# Patient Record
Sex: Female | Born: 1945
Health system: Southern US, Community
[De-identification: ages and names within clinical notes are randomized; demographics above are authoritative.]

---

## 1998-12-18 ENCOUNTER — Other Ambulatory Visit: Admission: RE | Admit: 1998-12-18 | Discharge: 1998-12-18 | Payer: Self-pay | Admitting: Emergency Medicine

## 2000-07-23 ENCOUNTER — Other Ambulatory Visit: Admission: RE | Admit: 2000-07-23 | Discharge: 2000-07-23 | Payer: Self-pay | Admitting: Emergency Medicine

## 2000-11-05 ENCOUNTER — Ambulatory Visit (HOSPITAL_COMMUNITY): Admission: RE | Admit: 2000-11-05 | Discharge: 2000-11-05 | Payer: Self-pay | Admitting: Gastroenterology

## 2003-08-01 ENCOUNTER — Emergency Department (HOSPITAL_COMMUNITY): Admission: EM | Admit: 2003-08-01 | Discharge: 2003-08-02 | Payer: Self-pay | Admitting: Emergency Medicine

## 2005-10-09 ENCOUNTER — Encounter: Admission: RE | Admit: 2005-10-09 | Discharge: 2005-10-09 | Payer: Self-pay | Admitting: Occupational Medicine

## 2015-12-04 DIAGNOSIS — H40033 Anatomical narrow angle, bilateral: Secondary | ICD-10-CM | POA: Diagnosis not present

## 2015-12-04 DIAGNOSIS — H04123 Dry eye syndrome of bilateral lacrimal glands: Secondary | ICD-10-CM | POA: Diagnosis not present

## 2015-12-07 ENCOUNTER — Encounter (INDEPENDENT_AMBULATORY_CARE_PROVIDER_SITE_OTHER): Payer: Self-pay | Admitting: Ophthalmology

## 2016-01-04 ENCOUNTER — Encounter (INDEPENDENT_AMBULATORY_CARE_PROVIDER_SITE_OTHER): Payer: Self-pay | Admitting: Ophthalmology

## 2016-01-05 ENCOUNTER — Ambulatory Visit (INDEPENDENT_AMBULATORY_CARE_PROVIDER_SITE_OTHER): Payer: Medicare Other | Admitting: Family Medicine

## 2016-01-05 VITALS — BP 126/82 | HR 69 | Temp 98.4°F | Resp 17 | Ht 67.0 in | Wt 175.0 lb

## 2016-01-05 DIAGNOSIS — Z23 Encounter for immunization: Secondary | ICD-10-CM | POA: Diagnosis not present

## 2016-01-05 DIAGNOSIS — I10 Essential (primary) hypertension: Secondary | ICD-10-CM | POA: Diagnosis not present

## 2016-01-05 DIAGNOSIS — R9431 Abnormal electrocardiogram [ECG] [EKG]: Secondary | ICD-10-CM

## 2016-01-05 DIAGNOSIS — E559 Vitamin D deficiency, unspecified: Secondary | ICD-10-CM

## 2016-01-05 DIAGNOSIS — R011 Cardiac murmur, unspecified: Secondary | ICD-10-CM | POA: Diagnosis not present

## 2016-01-05 DIAGNOSIS — N184 Chronic kidney disease, stage 4 (severe): Secondary | ICD-10-CM

## 2016-01-05 LAB — COMPREHENSIVE METABOLIC PANEL
ALBUMIN: 3.7 g/dL (ref 3.6–5.1)
ALK PHOS: 42 U/L (ref 33–130)
ALT: 8 U/L (ref 6–29)
AST: 14 U/L (ref 10–35)
BUN: 18 mg/dL (ref 7–25)
CHLORIDE: 108 mmol/L (ref 98–110)
CO2: 26 mmol/L (ref 20–31)
CREATININE: 1.82 mg/dL — AB (ref 0.50–0.99)
Calcium: 9.2 mg/dL (ref 8.6–10.4)
Glucose, Bld: 87 mg/dL (ref 65–99)
POTASSIUM: 4.5 mmol/L (ref 3.5–5.3)
Sodium: 140 mmol/L (ref 135–146)
TOTAL PROTEIN: 6 g/dL — AB (ref 6.1–8.1)
Total Bilirubin: 0.6 mg/dL (ref 0.2–1.2)

## 2016-01-05 MED ORDER — LISINOPRIL 40 MG PO TABS: 40.0000 mg | ORAL_TABLET | Freq: Every day | ORAL | 3 refills | Status: DC

## 2016-01-05 NOTE — Progress Notes (Signed)
Subjective:  By signing my name below, I, [name], attest that this documentation has been prepared under the direction and in the presence of Norberto SorensonEva Joselynne Killam, MD Electronically Signed: Charline BillsEssence Howell, ED Scribe 01/05/2016 at 11:17 AM.   Patient ID: Jacqueline SchilderMercedes Lacaze, female    DOB: 08-12-1945, 70 y.o.   MRN: [id number]  Chief Complaint  Patient presents with  . Medication Refill    lisinopril   . Immunizations    flu shot    HPI HPI Comments: Jacqueline Powell is a 70 y.o. female, with a h/o HTN, who presents to the [facility] for medication refill of Lisinopril. Pt has been on the medication for a while and denies any side effects. She currently has 2 tablets remaining. Pt takes the medication every morning. She checks her BP at home with average readings of 130/80-90. She denies chest pain/pressure, palpations, cough, sob, leg swelling, dizziness, light-headedness. However, she reports dental extraction and reports 1 episode of dizziness then but none since. She last had lab work done last June and her last CPE was last year. Pt would like to receive the flu vaccine at this visit. She recently moved here to live with her son.   No past medical history on file. No current outpatient prescriptions on file prior to visit.   No current facility-administered medications on file prior to visit.    Allergies not on file  Review of Systems  Respiratory: Negative for cough and shortness of breath.   Cardiovascular: Negative for chest pain, palpitations and leg swelling.  Neurological: Negative for dizziness and light-headedness.   BP 126/82 (BP Location: Right Arm, Patient Position: Sitting, Cuff Size: Normal)   Pulse 69   Temp 98.4 F (36.9 C) (Oral)   Resp 17   Ht 5\' 7"  (1.702 m)   Wt 175 lb (79.4 kg)   SpO2 97%   BMI 27.41 kg/m     Objective:   Physical Exam  Constitutional: She is oriented to person, place, and time. She appears well-developed and  well-nourished. No distress.  HENT:  Head: Normocephalic and atraumatic.  Eyes: Conjunctivae and EOM are normal.  Neck: Neck supple. No tracheal deviation present.  Cardiovascular: Normal rate, regular rhythm, S1 normal and S2 normal.   Murmur heard.  Systolic murmur is present with a grade of 2/6  Normal valves.   Pulmonary/Chest: Effort normal and breath sounds normal. No respiratory distress.  Musculoskeletal: Normal range of motion.  Neurological: She is alert and oriented to person, place, and time.  Skin: Skin is warm and dry.  Psychiatric: She has a normal mood and affect. Her behavior is normal.  Nursing note and vitals reviewed.    EKG: Normal sinus rhythm, flipped T's in 2, 3, and aVF. Assessment & Plan:   1. Essential hypertension, benign   2. Vitamin D deficiency   3. Undiagnosed cardiac murmurs   4. Need for prophylactic vaccination and inoculation against influenza   5. Nonspecific abnormal electrocardiogram (ECG) (EKG)   Refilled lisinopril, bp well controlled Pt Asymptomatic but does have new heart murmur and EKG changes consistent with prior ischemic event so referred to cardiology for further evaluation. Patient advised to follow-up anytime after 1 month to establish care with his PCP. Encouraged patient to establish with a new physician Dr. Alvy BimlerSagardia.  Orders Placed This Encounter  Procedures  . Flu Vaccine QUAD 36+ mos IM  . Comprehensive metabolic panel  . VITAMIN D 25 Hydroxy (Vit-D Deficiency, Fractures)  .  Ambulatory referral to Cardiology    Referral Priority:   Routine    Referral Type:   Consultation    Referral Reason:   Specialty Services Required    Requested Specialty:   Cardiology    Number of Visits Requested:   1  . EKG 12-Lead    Meds ordered this encounter  Medications  . lisinopril (PRINIVIL,ZESTRIL) 40 MG tablet    Sig: Take 1 tablet (40 mg total) by mouth daily.    Dispense:  90 tablet    Refill:  3    I personally performed the  services described in this documentation, which was scribed in my presence. The recorded information has been reviewed and considered, and addended by me as needed.   Norberto SorensonEva Rajiv Parlato, M.D.  [facility]  [place] [address] [place], [place] [address] [phone] phone [phone]) [phone] fax  01/05/16 12:09 PM

## 2016-01-05 NOTE — Patient Instructions (Addendum)
IF you received an x-ray today, you will receive an invoice from [facility]. Please contact [facility] at [phone] with questions or concerns regarding your invoice.   IF you received labwork today, you will receive an invoice from [facility]. Please contact [facility] at [phone] with questions or concerns regarding your invoice.   Our billing staff will not be able to assist you with questions regarding bills from these companies.  You will be contacted with the lab results as soon as they are available. The fastest way to get your results is to activate your My Chart account. Instructions are located on the last page of this paperwork. If you have not heard from us regarding the results in 2 weeks, please contact this office.    We recommend that you schedule a mammogram for breast cancer screening. Typically, you do not need a referral to do this. Please contact a local imaging center to schedule your mammogram.  [facility] - [phone]) [phone]  *ask for the Radiology Department [facility]) - [phone] or [phone]) [phone]  [facility] - [phone] [facility] - [phone]) [phone] [facility] - [phone]  *ask for the Radiology Department [facility] - [phone]  *ask for the Radiology Department [facility] - [phone]  *ask for the Mammography Department [facility] - [phone]  Soplo cardaco (Heart Murmur) Un soplo cardaco es un sonido adicional que su mdico oye cuando escucha su corazn con un dispositivo denominado estetoscopio. El sonido proviene de una turbulencia provocada cuando la sangre circula a travs del corazn y el sonido puede ser como un zumbido o un silbido cuando el corazn late. Existen dos tipos de soplos cardacos:  Soplo cardaco inocente. La mayora de las personas con este tipo de soplo  no tienen un problema cardaco. Muchos nios tienen soplos cardacos inocentes. Su mdico puede sugerir Jabil Circuitalgunos estudios bsicos para saber si el soplo es un soplo inocente. Si se descubre un soplo cardaco inocente, no hay necesidad de Sears Holdings Corporationhacer otros estudios, Tax inspectoradministrar tratamiento, restringir las actividades ni suspender la prctica de deportes.  Soplo cardaco anormal. Este tipo de soplo cardaco puede aparecer en nios y adultos. En los nios, por lo general los soplos cardacos anormales son causados por defectos cardacos que estn presentes desde el nacimiento (congnitos). En los adultos, los soplos anormales normalmente provienen de problemas en las vlvulas cardacas causados por una enfermedad, infeccin o el envejecimiento. CAUSAS Normalmente, estas vlvulas se abren para dejar que la sangre circule a travs o fuera del corazn y Engineer, miningluego se cierran para evitar que la sangre regrese. Si las vlvulas no funcionan correctamente, puede experimentar los siguientes sntomas:  Regurgitacin: cuando la sangre se filtra a travs de la vlvula en la direccin incorrecta.  Prolapso de la vlvula mitral: cuando la vlvula mitral del corazn tiene una aleta suelta y no se cierra hermticamente.  Estenosis: cuando la vlvula no se abre lo suficiente y bloquea el flujo sanguneo. Blake DivineSIGNOS Y SNTOMAS Los soplos inocentes no provocan sntomas, y Alexandratownmuchas personas con soplos anormales pueden tener sntomas o no. Si hay sntomas, pueden ser:  Harrel LemonFalta de aire.  Color azul en la piel, especialmente en las puntas de los dedos.  Dolor en el pecho.  Palpitaciones, sentir un aleteo o latido cardaco irregular.  Desmayos.  Tos persistente.  Cansarse con mucha ms rapidez de lo esperado. DIAGNSTICO Un soplo cardaco se puede escuchar  durante un examen mdico deportivo o durante otro tipo de examen. Cuando se escucha un soplo, esto puede sugerir un posible problema. Cuando esto ocurre, es posible que el mdico  le recomiende ver a Journalist, newspaperun especialista cardaco (cardilogo). Tambin le pueden pedir que se realice uno o ms estudios cardacos. En Franklin Resourcesestos casos, los estudios pueden variar segn lo que escuch su mdico. Estos son algunos de los estudios indicados para un soplo cardaco:  Materials engineerlectrocardiograma.  Ecocardiograma.  Resonancia magntica. Para nios y adultos que tienen un soplo cardaco anormal y desean Microbiologistpracticar deportes, es importante completar los estudios, Chiropractoranalizar los Admireresultados con su mdico y seguir sus TEFL teacherrecomendaciones. Si hay una enfermedad cardaca, es posible que no sea seguro practicar un deporte. TRATAMIENTO Los soplos inocentes no requieren tratamiento ni restriccin de actividades. Si un soplo anormal representa un problema en el corazn, el tratamiento depender de la naturaleza exacta del problema. En Franklin Resourcesestos casos, es posible que sean necesarios medicamentos o ciruga para tratar el problema. INSTRUCCIONES PARA EL CUIDADO EN EL HOGAR Si desea practicar un deporte u otros tipos de [place] Grenadinesactividad fsica intensa, es importante analizar esta situacin primero con su mdico. Si el soplo representa un problema en el corazn y decide practicar un deporte, existe una pequea probabilidad de que suceda un problema grave (incluida muerte sbita). SOLICITE ATENCIN MDICA SI:  Siente que sus sntomas empeoran lentamente.  Desarrolla sntomas nuevos que le preocupan.  Siente que los medicamentos recetados le provocan efectos secundarios. SOLICITE ATENCIN MDICA DE INMEDIATO SI:  Siente dolor en el pecho.  Le falta el aire.  Observa que su corazn late de forma irregular, con una frecuencia preocupante  Sufre episodios de Baxter Internationaldesmayo.  Los sntomas empeoran repentinamente. Esta informacin no tiene Theme park managercomo fin reemplazar el consejo del mdico. Asegrese de hacerle al mdico cualquier pregunta que tenga. Document Released: 02/04/2005 Document Revised: 02/25/2014 Document Reviewed: 08/10/2015 Elsevier  Interactive Patient Education  2017 Arvin[employer]vier Inc.

## 2016-01-06 LAB — VITAMIN D 25 HYDROXY (VIT D DEFICIENCY, FRACTURES): VIT D 25 HYDROXY: 22 ng/mL — AB (ref 30–100)

## 2016-01-06 MED ORDER — VITAMIN D (ERGOCALCIFEROL) 1.25 MG (50000 UNIT) PO CAPS: 50000.0000 [IU] | ORAL_CAPSULE | ORAL | 0 refills | Status: AC

## 2016-01-06 MED ORDER — AMLODIPINE BESYLATE 10 MG PO TABS: 10.0000 mg | ORAL_TABLET | Freq: Every day | ORAL | 0 refills | Status: AC

## 2016-01-06 NOTE — Addendum Note (Signed)
Addended by: Norberto SorensonSHAW, EVA on: 01/06/2016 11:42 AM   Modules accepted: Orders

## 2016-01-27 ENCOUNTER — Encounter: Payer: Self-pay | Admitting: *Deleted

## 2016-02-17 ENCOUNTER — Other Ambulatory Visit: Payer: Self-pay | Admitting: Family Medicine

## 2016-02-17 ENCOUNTER — Other Ambulatory Visit: Payer: Medicare Other

## 2016-02-17 DIAGNOSIS — N184 Chronic kidney disease, stage 4 (severe): Secondary | ICD-10-CM | POA: Diagnosis not present

## 2016-02-18 ENCOUNTER — Other Ambulatory Visit: Payer: Self-pay | Admitting: Family Medicine

## 2016-02-18 DIAGNOSIS — N184 Chronic kidney disease, stage 4 (severe): Secondary | ICD-10-CM | POA: Insufficient documentation

## 2016-02-18 LAB — CBC WITH DIFFERENTIAL/PLATELET
BASOS ABS: 0 10*3/uL (ref 0.0–0.2)
Basos: 1 %
EOS (ABSOLUTE): 0.2 10*3/uL (ref 0.0–0.4)
Eos: 3 %
HEMATOCRIT: 39.5 % (ref 34.0–46.6)
Hemoglobin: 13.4 g/dL (ref 11.1–15.9)
Immature Grans (Abs): 0 10*3/uL (ref 0.0–0.1)
Immature Granulocytes: 0 %
LYMPHS ABS: 2.1 10*3/uL (ref 0.7–3.1)
Lymphs: 35 %
MCH: 27.7 pg (ref 26.6–33.0)
MCHC: 33.9 g/dL (ref 31.5–35.7)
MCV: 82 fL (ref 79–97)
MONOS ABS: 0.4 10*3/uL (ref 0.1–0.9)
Monocytes: 6 %
Neutrophils Absolute: 3.4 10*3/uL (ref 1.4–7.0)
Neutrophils: 55 %
Platelets: 171 10*3/uL (ref 150–379)
RBC: 4.84 x10E6/uL (ref 3.77–5.28)
RDW: 14.6 % (ref 12.3–15.4)
WBC: 6.2 10*3/uL (ref 3.4–10.8)

## 2016-02-18 LAB — CMP14+EGFR
ALK PHOS: 57 IU/L (ref 39–117)
ALT: 10 IU/L (ref 0–32)
AST: 16 IU/L (ref 0–40)
Albumin/Globulin Ratio: 1.5 (ref 1.2–2.2)
Albumin: 4.3 g/dL (ref 3.6–4.8)
BILIRUBIN TOTAL: 0.4 mg/dL (ref 0.0–1.2)
BUN/Creatinine Ratio: 11 — ABNORMAL LOW (ref 12–28)
BUN: 24 mg/dL (ref 8–27)
CHLORIDE: 103 mmol/L (ref 96–106)
CO2: 20 mmol/L (ref 18–29)
Calcium: 9.7 mg/dL (ref 8.7–10.3)
Creatinine, Ser: 2.09 mg/dL — ABNORMAL HIGH (ref 0.57–1.00)
GFR calc Af Amer: 27 mL/min/{1.73_m2} — ABNORMAL LOW (ref >59)
GFR calc non Af Amer: 24 mL/min/{1.73_m2} — ABNORMAL LOW (ref >59)
GLUCOSE: 83 mg/dL (ref 65–99)
Globulin, Total: 2.9 g/dL (ref 1.5–4.5)
Potassium: 4.8 mmol/L (ref 3.5–5.2)
Sodium: 140 mmol/L (ref 134–144)
TOTAL PROTEIN: 7.2 g/dL (ref 6.0–8.5)

## 2016-02-18 LAB — MICROALBUMIN / CREATININE URINE RATIO
CREATININE, UR: 59.9 mg/dL
MICROALB/CREAT RATIO: 104.3 mg/g{creat} — AB (ref 0.0–30.0)
MICROALBUM., U, RANDOM: 62.5 ug/mL

## 2016-02-18 NOTE — Progress Notes (Signed)
Here for lab only visit. Has appt in 3d to review lab results

## 2016-02-20 ENCOUNTER — Ambulatory Visit: Payer: Medicare Other

## 2016-02-21 ENCOUNTER — Encounter: Payer: Self-pay | Admitting: Emergency Medicine

## 2016-02-21 ENCOUNTER — Ambulatory Visit (INDEPENDENT_AMBULATORY_CARE_PROVIDER_SITE_OTHER): Payer: 59 | Admitting: Emergency Medicine

## 2016-02-21 VITALS — BP 166/80 | HR 103 | Temp 98.1°F | Resp 16 | Wt 175.2 lb

## 2016-02-21 DIAGNOSIS — N184 Chronic kidney disease, stage 4 (severe): Secondary | ICD-10-CM | POA: Diagnosis not present

## 2016-02-21 DIAGNOSIS — I10 Essential (primary) hypertension: Secondary | ICD-10-CM | POA: Diagnosis not present

## 2016-02-21 MED ORDER — LISINOPRIL 20 MG PO TABS: 20.0000 mg | ORAL_TABLET | Freq: Every day | ORAL | 3 refills | Status: AC

## 2016-02-21 NOTE — Patient Instructions (Signed)
Hipertensin (Hypertension) El trmino hipertensin es otra forma de denominar a la presin arterial elevada. La presin arterial elevada fuerza al corazn a trabajar ms para bombear la sangre. Una lectura de la presin arterial consta de dos nmeros: uno ms alto sobre uno ms bajo (por ejemplo, 110/72). CUIDADOS EN EL HOGAR  Haga que el mdico le tome nuevamente la presin arterial.  Tome los medicamentos solamente como se lo haya indicado el mdico. Siga cuidadosamente las indicaciones. Los medicamentos pierden eficacia si omite dosis. El hecho de omitir las dosis tambin Lesothoaumenta el riesgo de otros problemas.  No fume.  Contrlese la presin arterial en su casa como se lo haya indicado el mdico. SOLICITE AYUDA SI:  Piensa que tiene una reaccin a los medicamentos que est tomando.  Tiene mareos o dolores de cabeza reiterados.  Se le inflaman (hinchan) los tobillos.  Tiene problemas de visin. SOLICITE AYUDA DE INMEDIATO SI:  Tiene un dolor de cabeza muy intenso y est confundido.  Se siente dbil, aturdido o se desmaya.  Tiene dolor en el pecho o el estmago (abdominal).  Tiene vmitos.  No puede respirar Kimberly-Clarkmuy bien. ASEGRESE DE QUE:  Comprende estas instrucciones.  Controlar su afeccin.  Recibir ayuda de inmediato si no mejora o si empeora. Esta informacin no tiene Theme park managercomo fin reemplazar el consejo del mdico. Asegrese de hacerle al mdico cualquier pregunta que tenga. Document Released: 07/25/2009 Document Revised: 02/09/2013 Document Reviewed: 11/27/2012 Elsevier Interactive Patient Education  2017 Elsevier Inc. Enfermedad renal crnica en los adultos (Chronic Kidney Disease, Adult) La enfermedad renal crnica se produce cuando los riones sufren un dao durante un perodo de 3meses o ms. Los riones son dos rganos que cumplen muchas funciones importantes en el organismo. Estas funciones incluyen las siguientes:  Eliminar desechos y el exceso de lquido de  la Gant[place]angre.  Producir hormonas que Bed Bath & Beyondmantienen la cantidad de lquido Teachers Insurance and Annuity Associationen los tejidos y los vasos sanguneos.  Mantener la cantidad correcta de lquidos y de sustancias qumicas en el organismo. La mayora de las veces, esta afeccin no desaparece, pero a menudo puede ser Winn-Dixiecontrolada. Se deben tomar medidas para frenar el dao renal o evitar que empeore. Weyerhaeuser CompanyDe otro modo, los riones pueden dejar de funcionar. CUIDADOS EN EL HOGAR  Siga su dieta segn las indicaciones de su mdico. Tal vez deba evitar el alcohol, los alimentos salados (sodio) y aquellos con alto contenido de potasio, calcio y protenas.  Tome los medicamentos de venta libre y los recetados solamente como se lo haya indicado el mdico. No tome ningn medicamento nuevo a menos que el mdico lo autorice. Estos incluyen las vitaminas y los minerales.  Los medicamentos y los suplementos nutricionales pueden agravar el dao renal.  Es posible que el mdico deba modificar la cantidad de medicamentos que toma.  No use productos que contengan tabaco. Estos incluyen cigarrillos, tabaco para mascar y Administrator, Civil Servicecigarrillos electrnicos. Si necesita ayuda para dejar de fumar, consulte al mdico.  Concurra a todas las visitas de control como se lo haya indicado el mdico. Esto es importante.  Controlar su presin arterial. Informe al mdico si tiene cambios en la presin arterial.  Alcance un peso saludable. Mantenga ese peso. Si necesita ayuda para lograrlo, consulte al mdico.  Comience o contine un plan de ejercicios. Intente hacer ejercicios al menos 30minutos al da, 5das a la semana.  Mantngase al da con las vacunas como se lo haya indicado el mdico. SOLICITE AYUDA SI:  Los sntomas empeoran.  Aparecen nuevos sntomas. SOLICITE AYUDA  DE INMEDIATO SI:  Tiene sntomas de enfermedad renal terminal. Estos incluyen los siguientes:  Dolores de [place].  Piel ms oscura o ms clara que lo normal.  Adormecimiento de las manos o de los  pies.  Aparecen hematomas con facilidad.  Hipo frecuente.  Dolor en el pecho.  Falta de aire.  Ausencia de la [employer].  Tiene fiebre.  Orina muy poco.  Siente dolor o sangra al Beatrix Shipper. Esta informacin no tiene Theme park manager el consejo del mdico. Asegrese de hacerle al mdico cualquier pregunta que tenga. Document Released: 03/09/2010 Document Revised: 05/29/2015 Document Reviewed: 10/04/2011 Elsevier Interactive Patient Education  2017 Arvin[employer].

## 2016-02-21 NOTE — Progress Notes (Signed)
Novia Justus 71 y.o.   Chief Complaint  Patient presents with  . Follow-up    abnormal labs    HISTORY OF PRESENT ILLNESS: This is a 71 y.o. female here for follow up, evaluation of HTN with CKD. Was started on Amlodipine and Lisinopril discontinued; she was taking 40mg  for many years. Asymptomatic, has no complaints.  HPI   Prior to Admission medications   Medication Sig Start Date End Date Taking? Authorizing Provider  amLODipine (NORVASC) 10 MG tablet Take 1 tablet (10 mg total) by mouth daily. 01/06/16  Yes Sherren Mocha, MD  Vitamin D, Ergocalciferol, (DRISDOL) 50000 units CAPS capsule Take 1 capsule (50,000 Units total) by mouth every 7 (seven) days. 01/06/16  Yes Sherren Mocha, MD    No Known Allergies  Patient Active Problem List   Diagnosis Date Noted  . CRF (chronic renal failure), stage 4 (severe) (HCC) 02/18/2016    No past medical history on file.  No past surgical history on file.  Social History   Social History  . Marital status: Divorced    Spouse name: N/A  . Number of children: N/A  . Years of education: N/A   Occupational History  . Not on file.   Social History Main Topics  . Smoking status: Never Smoker  . Smokeless tobacco: Never Used  . Alcohol use No  . Drug use: No  . Sexual activity: Not on file   Other Topics Concern  . Not on file   Social History Narrative  . No narrative on file    No family history on file.   Review of Systems  Constitutional: Negative.   HENT: Negative.   Eyes: Negative.   Respiratory: Negative.   Cardiovascular: Negative.   Gastrointestinal: Negative.   Genitourinary: Negative.   Musculoskeletal: Negative.   Skin:       Mild LE edema since starting Amlodipine.  Neurological: Negative.   Endo/Heme/Allergies: Negative.   Psychiatric/Behavioral: Negative.   All other systems reviewed and are negative.   Vitals:   02/21/16 1053  BP: (!) 166/80  Pulse: (!) 103  Resp: 16  Temp: 98.1 F  (36.7 C)    Physical Exam  Constitutional: She is oriented to person, place, and time. She appears well-developed and well-nourished.  HENT:  Head: Normocephalic and atraumatic.  Nose: Nose normal.  Mouth/Throat: Oropharynx is clear and moist.  Eyes: Conjunctivae and EOM are normal. Pupils are equal, round, and reactive to light.  Neck: Normal range of motion. Neck supple.  Cardiovascular: Normal rate, regular rhythm, normal heart sounds and intact distal pulses.   Pulmonary/Chest: Effort normal and breath sounds normal.  Abdominal: Soft. Bowel sounds are normal. There is no tenderness.  Musculoskeletal: She exhibits edema (mild non-pitting LE).  Neurological: She is alert and oriented to person, place, and time.  Skin: Skin is warm and dry. Capillary refill takes less than 2 seconds.  Psychiatric: She has a normal mood and affect. Her behavior is normal.   Labs reviewed.   ASSESSMENT & PLAN: Meghann was seen today for follow-up.  Diagnoses and all orders for this visit:  Essential hypertension -     Ambulatory referral to Nephrology  Stage 4 chronic kidney disease (HCC)  Other orders -     lisinopril (PRINIVIL,ZESTRIL) 20 MG tablet; Take 1 tablet (20 mg total) by mouth daily.  Worsening renal function with significant microalbuminuria and hypertension. Will re-start Lisinopril and obtain renal consult.   Patient Instructions  Hipertensin (Hypertension) El  trmino hipertensin es otra forma de denominar a la presin arterial elevada. La presin arterial elevada fuerza al corazn a trabajar ms para bombear la sangre. Una lectura de la presin arterial consta de dos nmeros: uno ms alto sobre uno ms bajo (por ejemplo, 110/72). CUIDADOS EN EL HOGAR  Haga que el mdico le tome nuevamente la presin arterial.  Tome los medicamentos solamente como se lo haya indicado el mdico. Siga cuidadosamente las indicaciones. Los medicamentos pierden eficacia si omite dosis. El  hecho de omitir las dosis tambin Lesothoaumenta el riesgo de otros problemas.  No fume.  Contrlese la presin arterial en su casa como se lo haya indicado el mdico. SOLICITE AYUDA SI:  Piensa que tiene una reaccin a los medicamentos que est tomando.  Tiene mareos o dolores de cabeza reiterados.  Se le inflaman (hinchan) los tobillos.  Tiene problemas de visin. SOLICITE AYUDA DE INMEDIATO SI:  Tiene un dolor de cabeza muy intenso y est confundido.  Se siente dbil, aturdido o se desmaya.  Tiene dolor en el pecho o el estmago (abdominal).  Tiene vmitos.  No puede respirar Kimberly-Clarkmuy bien. ASEGRESE DE QUE:  Comprende estas instrucciones.  Controlar su afeccin.  Recibir ayuda de inmediato si no mejora o si empeora. Esta informacin no tiene Theme park managercomo fin reemplazar el consejo del mdico. Asegrese de hacerle al mdico cualquier pregunta que tenga. Document Released: 07/25/2009 Document Revised: 02/09/2013 Document Reviewed: 11/27/2012 Elsevier Interactive Patient Education  2017 Elsevier Inc. Enfermedad renal crnica en los adultos (Chronic Kidney Disease, Adult) La enfermedad renal crnica se produce cuando los riones sufren un dao durante un perodo de 3meses o ms. Los riones son dos rganos que cumplen muchas funciones importantes en el organismo. Estas funciones incluyen las siguientes:  Eliminar desechos y el exceso de lquido de la Jenisonsangre.  Producir hormonas que Bed Bath & Beyondmantienen la cantidad de lquido Teachers Insurance and Annuity Associationen los tejidos y los vasos sanguneos.  Mantener la cantidad correcta de lquidos y de sustancias qumicas en el organismo. La mayora de las veces, esta afeccin no desaparece, pero a menudo puede ser Winn-Dixiecontrolada. Se deben tomar medidas para frenar el dao renal o evitar que empeore. Weyerhaeuser CompanyDe otro modo, los riones pueden dejar de funcionar. CUIDADOS EN EL HOGAR  Siga su dieta segn las indicaciones de su mdico. Tal vez deba evitar el alcohol, los alimentos salados (sodio) y  aquellos con alto contenido de potasio, calcio y protenas.  Tome los medicamentos de venta libre y los recetados solamente como se lo haya indicado el mdico. No tome ningn medicamento nuevo a menos que el mdico lo autorice. Estos incluyen las vitaminas y los minerales.  Los medicamentos y los suplementos nutricionales pueden agravar el dao renal.  Es posible que el mdico deba modificar la cantidad de medicamentos que toma.  No use productos que contengan tabaco. Estos incluyen cigarrillos, tabaco para mascar y Administrator, Civil Servicecigarrillos electrnicos. Si necesita ayuda para dejar de fumar, consulte al mdico.  Concurra a todas las visitas de control como se lo haya indicado el mdico. Esto es importante.  Controlar su presin arterial. Informe al mdico si tiene cambios en la presin arterial.  Alcance un peso saludable. Mantenga ese peso. Si necesita ayuda para lograrlo, consulte al mdico.  Comience o contine un plan de ejercicios. Intente hacer ejercicios al menos 30minutos al da, 5das a la semana.  Mantngase al da con las vacunas como se lo haya indicado el mdico. SOLICITE AYUDA SI:  Los sntomas empeoran.  Aparecen nuevos sntomas. SOLICITE AYUDA DE INMEDIATO  SI:  Tiene sntomas de enfermedad renal terminal. Estos incluyen los siguientes:  Dolores de [place].  Piel ms oscura o ms clara que lo normal.  Adormecimiento de las manos o de los pies.  Aparecen hematomas con facilidad.  Hipo frecuente.  Dolor en el pecho.  Falta de aire.  Ausencia de la [employer].  Tiene fiebre.  Orina muy poco.  Siente dolor o sangra al Beatrix Shipper. Esta informacin no tiene Theme park manager el consejo del mdico. Asegrese de hacerle al mdico cualquier pregunta que tenga. Document Released: 03/09/2010 Document Revised: 05/29/2015 Document Reviewed: 10/04/2011 Elsevier Interactive Patient Education  2017 Elsevier Inc.      Edwina Barth, MD [facility]

## 2016-03-20 ENCOUNTER — Telehealth: Payer: Self-pay

## 2016-03-20 NOTE — Telephone Encounter (Signed)
[facility] has scheduled an apt with pt via our referral (apt is 2/6/1). They discovered that the pt has [employer] which requires insurance referrals before any office visits. Her primary care is listed as a dr that does not work at our office. We have filed this insurance today as the pt did not present a card during her last visit.   Pt needs to change PCP with insurance to Dr Clelia CroftShaw if she wants to continue with the referral and continue seeing our office.  I left a VM with Iris (niece/listed on release) stating the above information and asking for them to call and change the PCP. The number to call is [phone].

## 2016-04-05 ENCOUNTER — Other Ambulatory Visit: Payer: Self-pay | Admitting: Internal Medicine

## 2016-05-09 ENCOUNTER — Other Ambulatory Visit: Payer: Self-pay | Admitting: Internal Medicine

## 2016-05-09 DIAGNOSIS — N184 Chronic kidney disease, stage 4 (severe): Secondary | ICD-10-CM

## 2016-05-09 DIAGNOSIS — R809 Proteinuria, unspecified: Secondary | ICD-10-CM | POA: Diagnosis not present

## 2016-05-16 ENCOUNTER — Ambulatory Visit
Admission: RE | Admit: 2016-05-16 | Discharge: 2016-05-16 | Disposition: A | Discharge status: Home / Self Care | Payer: 59 | Source: Ambulatory Visit | Attending: Internal Medicine | Admitting: Internal Medicine

## 2016-05-16 DIAGNOSIS — N184 Chronic kidney disease, stage 4 (severe): Secondary | ICD-10-CM

## 2016-06-06 ENCOUNTER — Ambulatory Visit
Admission: RE | Admit: 2016-06-06 | Discharge: 2016-06-06 | Disposition: A | Discharge status: Home / Self Care | Payer: 59 | Source: Ambulatory Visit | Attending: Internal Medicine | Admitting: Internal Medicine

## 2016-06-06 ENCOUNTER — Other Ambulatory Visit: Payer: Self-pay | Admitting: Internal Medicine

## 2016-06-06 DIAGNOSIS — R079 Chest pain, unspecified: Secondary | ICD-10-CM

## 2016-06-12 ENCOUNTER — Other Ambulatory Visit (HOSPITAL_COMMUNITY): Payer: Self-pay | Admitting: Internal Medicine

## 2016-06-12 DIAGNOSIS — R071 Chest pain on breathing: Secondary | ICD-10-CM

## 2016-06-13 ENCOUNTER — Encounter (HOSPITAL_COMMUNITY): Payer: Self-pay | Admitting: Radiology

## 2016-06-13 ENCOUNTER — Ambulatory Visit (HOSPITAL_COMMUNITY)
Admission: RE | Admit: 2016-06-13 | Discharge: 2016-06-13 | Disposition: A | Discharge status: Home / Self Care | Payer: Medicare (Managed Care) | Source: Ambulatory Visit | Attending: Internal Medicine | Admitting: Internal Medicine

## 2016-06-13 ENCOUNTER — Encounter (HOSPITAL_COMMUNITY)
Admission: RE | Admit: 2016-06-13 | Discharge: 2016-06-13 | Disposition: A | Discharge status: Home / Self Care | Payer: Medicare (Managed Care) | Source: Ambulatory Visit | Attending: Internal Medicine | Admitting: Internal Medicine

## 2016-06-13 DIAGNOSIS — Q613 Polycystic kidney, unspecified: Secondary | ICD-10-CM | POA: Diagnosis not present

## 2016-06-13 DIAGNOSIS — R071 Chest pain on breathing: Secondary | ICD-10-CM

## 2016-06-13 DIAGNOSIS — N182 Chronic kidney disease, stage 2 (mild): Secondary | ICD-10-CM | POA: Diagnosis not present

## 2016-06-13 DIAGNOSIS — R7989 Other specified abnormal findings of blood chemistry: Secondary | ICD-10-CM | POA: Insufficient documentation

## 2016-06-13 MED ORDER — TECHNETIUM TC 99M DIETHYLENETRIAME-PENTAACETIC ACID: 32.2000 | Freq: Once | INTRAVENOUS | Status: DC | PRN

## 2016-06-13 MED ORDER — TECHNETIUM TO 99M ALBUMIN AGGREGATED: 4.2000 | Freq: Once | INTRAVENOUS | Status: DC | PRN

## 2016-06-19 ENCOUNTER — Ambulatory Visit (HOSPITAL_COMMUNITY): Payer: 59

## 2016-06-24 DIAGNOSIS — K090 Developmental odontogenic cysts: Secondary | ICD-10-CM | POA: Diagnosis not present

## 2017-09-01 DIAGNOSIS — R42 Dizziness and giddiness: Secondary | ICD-10-CM | POA: Diagnosis not present

## 2017-09-01 DIAGNOSIS — R935 Abnormal findings on diagnostic imaging of other abdominal regions, including retroperitoneum: Secondary | ICD-10-CM | POA: Diagnosis not present

## 2017-09-01 DIAGNOSIS — R9431 Abnormal electrocardiogram [ECG] [EKG]: Secondary | ICD-10-CM | POA: Diagnosis not present

## 2017-09-01 DIAGNOSIS — R944 Abnormal results of kidney function studies: Secondary | ICD-10-CM | POA: Diagnosis not present

## 2018-07-02 IMAGING — CR DG CHEST 2V
2 series · 2 of 2 positions shown · non-contrast
Comparison: 08/01/2003 report.

CLINICAL DATA: Chest wall pain.

EXAM:
CHEST  2 VIEW

[w chest pa]
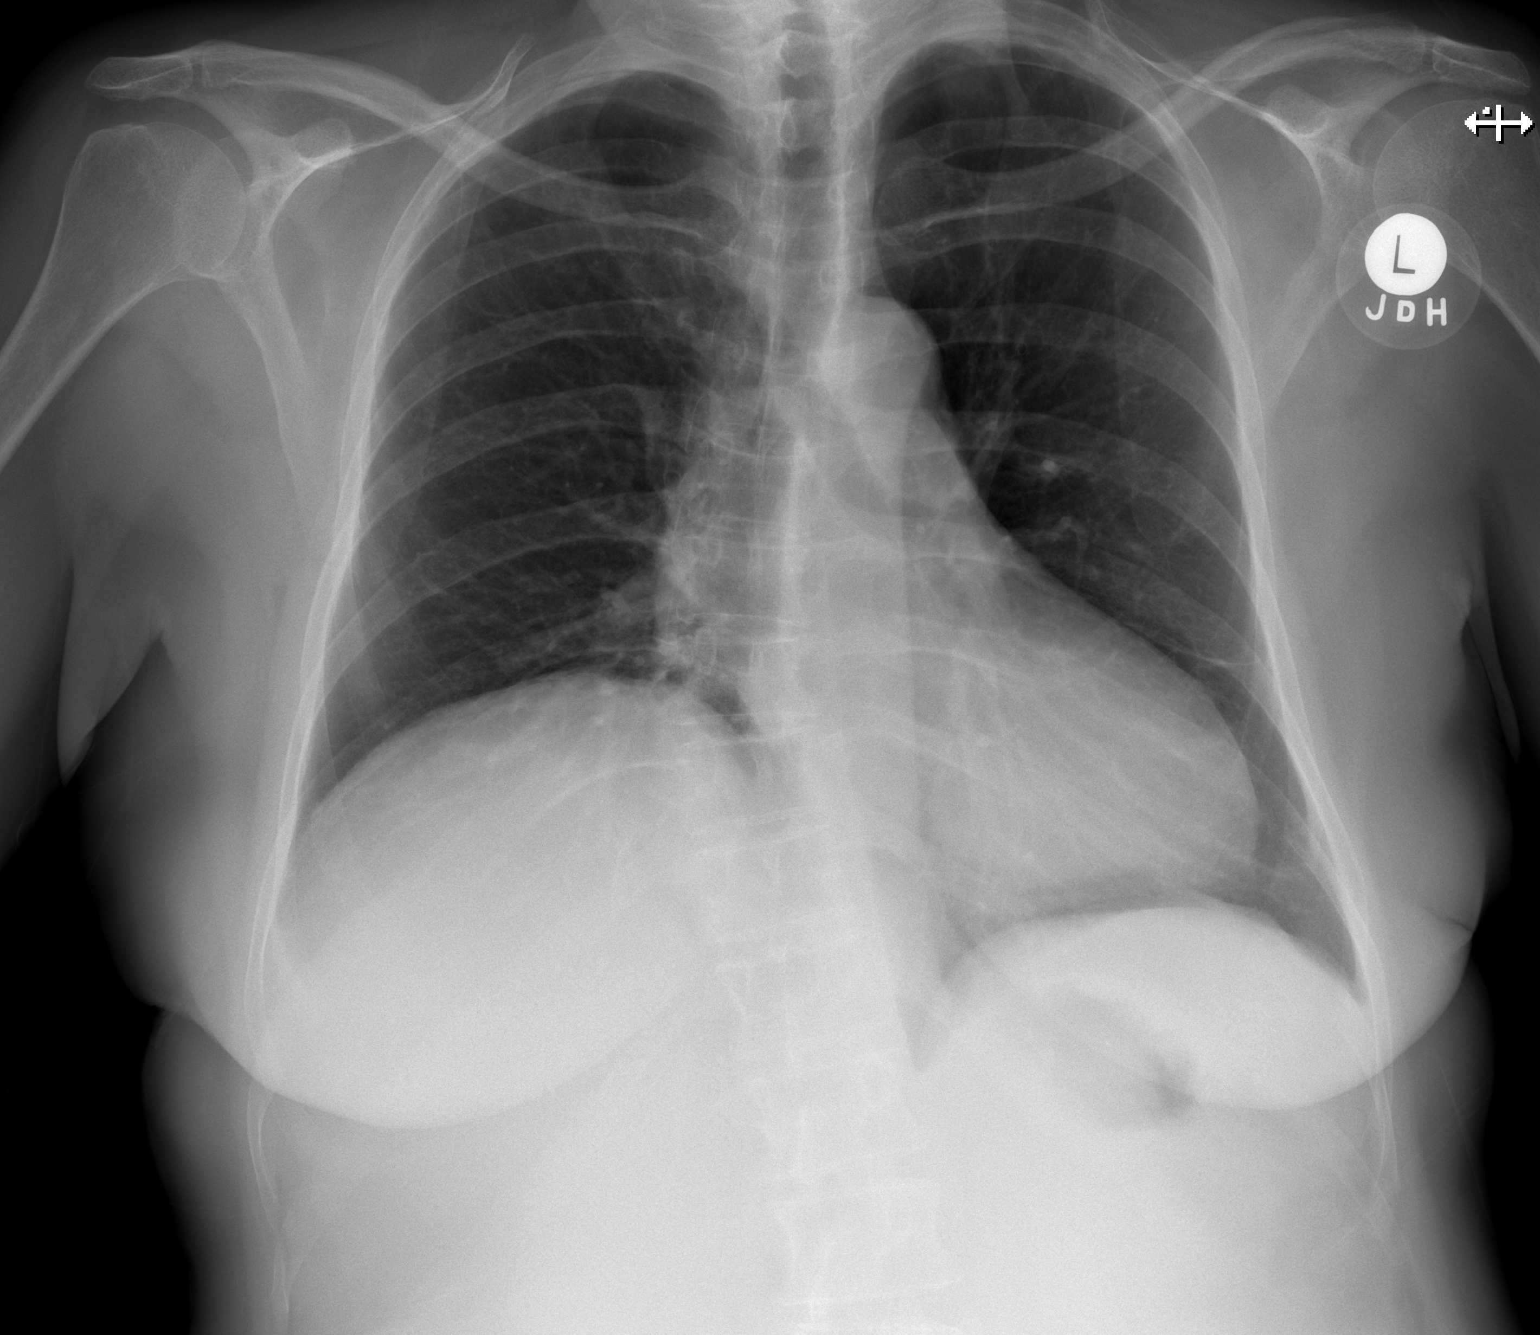

[w chest lat]
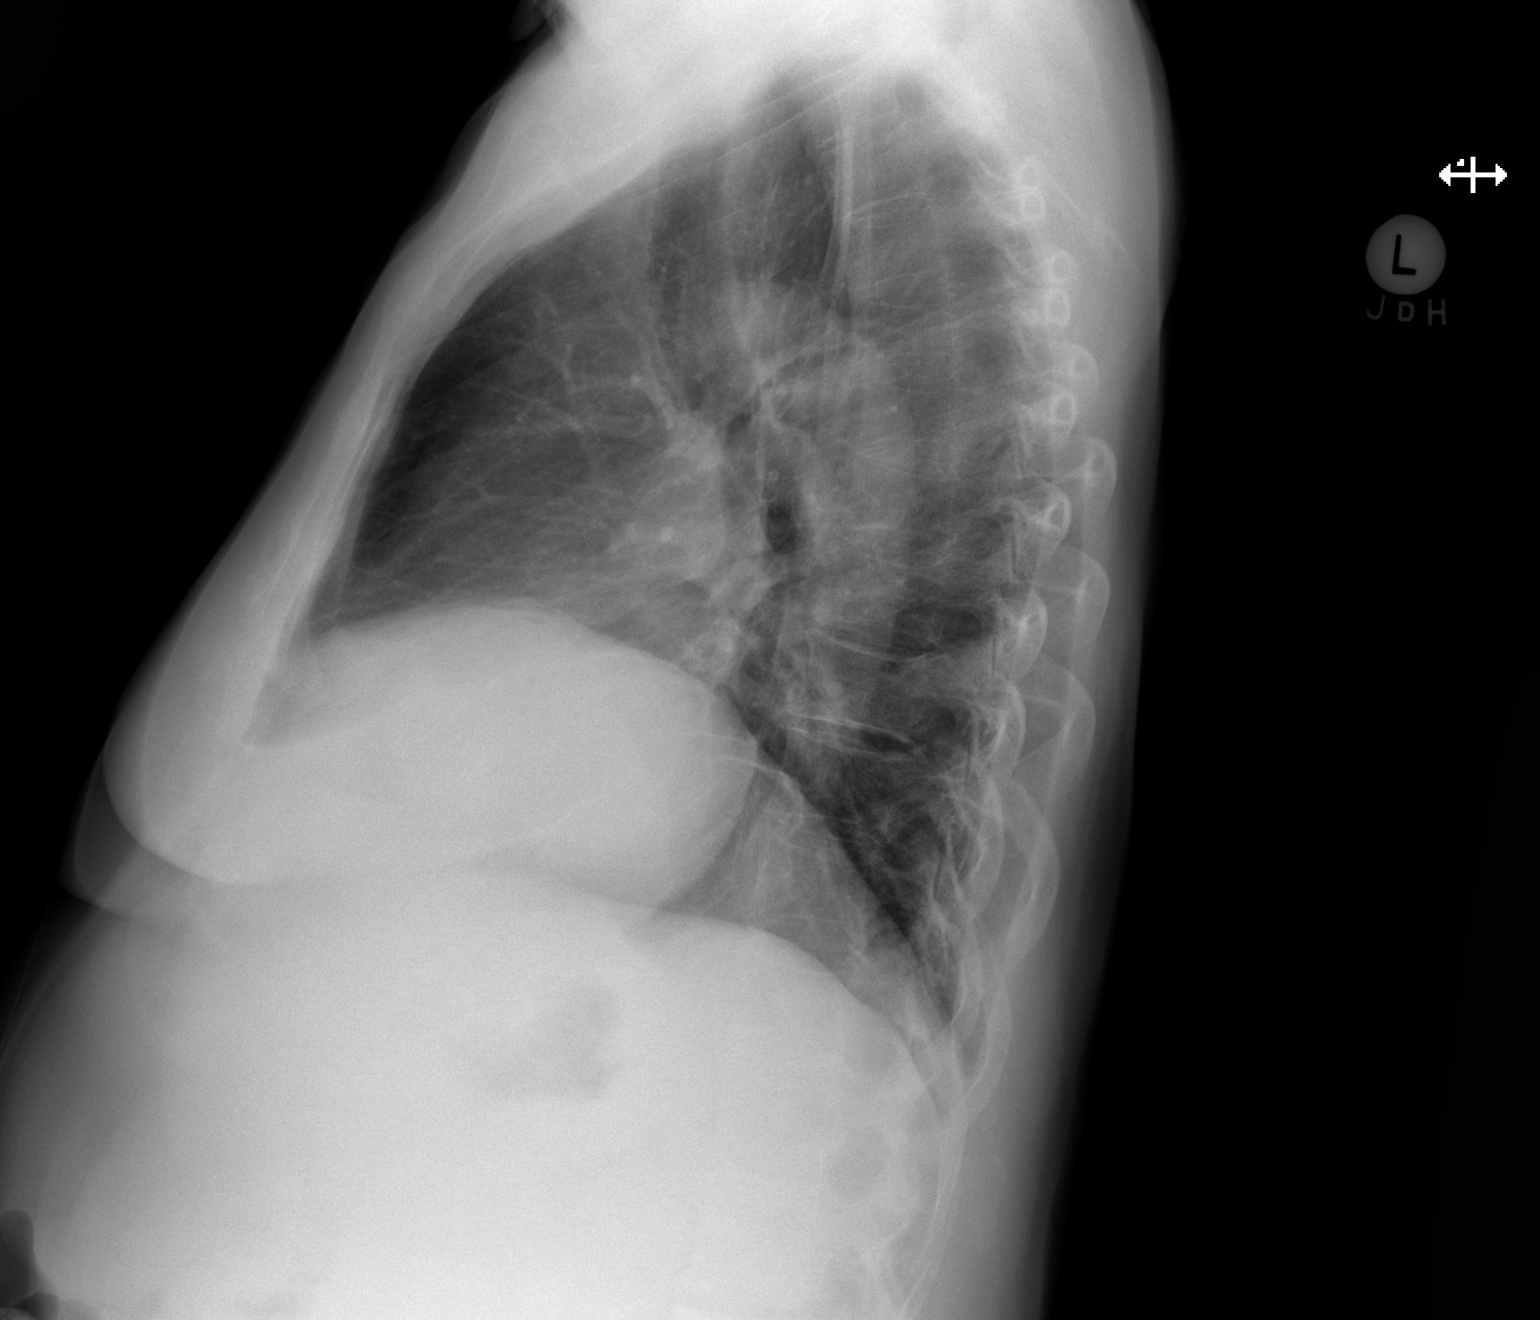

[2 of 2 positions shown; findings below may reference images not displayed]

FINDINGS: Mediastinum and hilar structures are normal. Low lung volumes with
mild basilar atelectasis. Heart size normal. Thoracic spine
scoliosis. No acute bony abnormality. Thoracic spine scoliosis.
IMPRESSION: Low lung volumes with mild basilar atelectasis.  Heart size normal.

## 2018-07-09 IMAGING — NM NM PULMONARY VENT & PERF
16 series · 16 of 16 positions shown · non-contrast
Comparison: Chest radiograph June 13, 2016

CLINICAL DATA: Chest pain and shortness of breath

EXAM:
NUCLEAR MEDICINE VENTILATION - PERFUSION LUNG SCAN
VIEWS:
Anterior, posterior, left lateral, right lateral, RPO, LPO, RAO, LAO
-ventilation and profusion
RADIOPHARMACEUTICALS:  32.2 mCi Yechnetium-IIm DTPA aerosol
inhalation and 4.2 mCi Yechnetium-IIm MAA IV

[Series 1: ant/post vent · 4.14mm/px · 1 of 1 slices shown (1 of 2)]
[im 1/1]
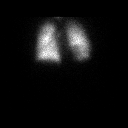

[Series 1: ant/post vent · 4.14mm/px · 1 of 1 slices shown (2 of 2)]
[im 1/1]
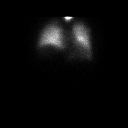

[Series 2: lao/rpo vent · 4.14mm/px · 1 of 1 slices shown (1 of 2)]
[im 1/1]
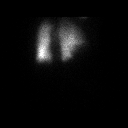

[Series 2: lao/rpo vent · 4.14mm/px · 1 of 1 slices shown (2 of 2)]
[im 1/1]
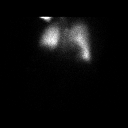

[Series 3: lpo/rao vent · 4.14mm/px · 1 of 1 slices shown (1 of 2)]
[im 1/1]
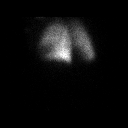

[Series 3: lpo/rao vent · 4.14mm/px · 1 of 1 slices shown (2 of 2)]
[im 1/1]
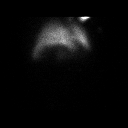

[Series 4: lt lat/rt lat vent · 4.14mm/px · 1 of 1 slices shown (1 of 2)]
[im 1/1]
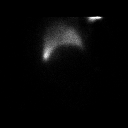

[Series 4: lt lat/rt lat vent · 4.14mm/px · 1 of 1 slices shown (2 of 2)]
[im 1/1]
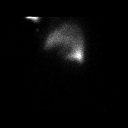

[Series 5: lt lat/rt lat perf · 4.14mm/px · 1 of 1 slices shown (1 of 2)]
[im 1/1]
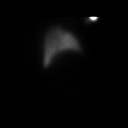

[Series 5: lt lat/rt lat perf · 4.14mm/px · 1 of 1 slices shown (2 of 2)]
[im 1/1]
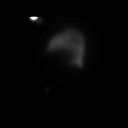

[Series 6: lpo/rao perf · 4.14mm/px · 1 of 1 slices shown (1 of 2)]
[im 1/1]
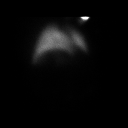

[Series 6: lpo/rao perf · 4.14mm/px · 1 of 1 slices shown (2 of 2)]
[im 1/1]
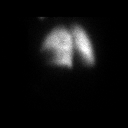

[Series 7: ant/post perf · 4.14mm/px · 1 of 1 slices shown (1 of 2)]
[im 1/1]
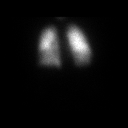

[Series 7: ant/post perf · 4.14mm/px · 1 of 1 slices shown (2 of 2)]
[im 1/1]
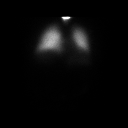

[Series 8: lao/rpo perf · 4.14mm/px · 1 of 1 slices shown (1 of 2)]
[im 1/1]
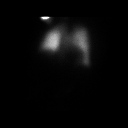

[Series 8: lao/rpo perf · 4.14mm/px · 1 of 1 slices shown (2 of 2)]
[im 1/1]
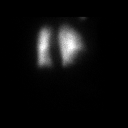

[16 of 16 positions shown; findings below may reference images not displayed]

FINDINGS: Ventilation: Radiotracer uptake bilaterally is homogeneous and
symmetric. There are no appreciable ventilation defects.

Perfusion: Radiotracer uptake bilaterally is homogeneous and
symmetric. There are no appreciable perfusion defects.

Elevation of the right hemidiaphragm is noted as is also noted on
the recent chest radiograph.
IMPRESSION: No appreciable ventilation or perfusion defects. Very low
probability of pulmonary embolus.
